# Patient Record
Sex: Female | Born: 2000 | Race: White | Hispanic: No | Marital: Single | Smoking: Never smoker
Health system: Southern US, Community
[De-identification: ages and names within clinical notes are randomized; demographics above are authoritative.]

## PROBLEM LIST (undated history)

## (undated) DIAGNOSIS — R1115 Cyclical vomiting syndrome unrelated to migraine: Secondary | ICD-10-CM

---
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
## 2019-11-03 ENCOUNTER — Emergency Department (HOSPITAL_BASED_OUTPATIENT_CLINIC_OR_DEPARTMENT_OTHER)
Admission: EM | Admit: 2019-11-03 | Discharge: 2019-11-03 | Disposition: A | Discharge status: Home / Self Care | Payer: No Typology Code available for payment source | Attending: Emergency Medicine | Admitting: Emergency Medicine

## 2019-11-03 ENCOUNTER — Other Ambulatory Visit: Payer: Self-pay

## 2019-11-03 ENCOUNTER — Emergency Department (HOSPITAL_BASED_OUTPATIENT_CLINIC_OR_DEPARTMENT_OTHER): Payer: No Typology Code available for payment source

## 2019-11-03 ENCOUNTER — Encounter (HOSPITAL_BASED_OUTPATIENT_CLINIC_OR_DEPARTMENT_OTHER): Payer: Self-pay

## 2019-11-03 DIAGNOSIS — S29002A Unspecified injury of muscle and tendon of back wall of thorax, initial encounter: Secondary | ICD-10-CM | POA: Insufficient documentation

## 2019-11-03 DIAGNOSIS — M546 Pain in thoracic spine: Secondary | ICD-10-CM

## 2019-11-03 DIAGNOSIS — Y93I9 Activity, other involving external motion: Secondary | ICD-10-CM | POA: Insufficient documentation

## 2019-11-03 DIAGNOSIS — Y92410 Unspecified street and highway as the place of occurrence of the external cause: Secondary | ICD-10-CM | POA: Insufficient documentation

## 2019-11-03 DIAGNOSIS — Y998 Other external cause status: Secondary | ICD-10-CM | POA: Diagnosis not present

## 2019-11-03 HISTORY — DX: Cyclical vomiting syndrome unrelated to migraine: R11.15

## 2019-11-03 MED ORDER — METHOCARBAMOL 500 MG PO TABS: 500.0000 mg | ORAL_TABLET | Freq: Two times a day (BID) | ORAL | 0 refills | Status: DC

## 2019-11-03 MED ORDER — IBUPROFEN 400 MG PO TABS
600.0000 mg | ORAL_TABLET | Freq: Once | ORAL | Status: AC
  Administered 2019-11-03: 20:00:00 600 mg via ORAL
  Filled 2019-11-03: qty 1
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
## 2019-11-03 NOTE — Discharge Instructions (Signed)

## 2019-11-03 NOTE — ED Notes (Signed)
ED Provider at bedside. 
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
## 2023-02-13 ENCOUNTER — Emergency Department (HOSPITAL_COMMUNITY): Payer: Self-pay

## 2023-02-13 ENCOUNTER — Other Ambulatory Visit: Payer: Self-pay

## 2023-02-13 ENCOUNTER — Emergency Department (HOSPITAL_COMMUNITY)
Admission: EM | Admit: 2023-02-13 | Discharge: 2023-02-14 | Disposition: A | Discharge status: Home / Self Care | Payer: Self-pay | Attending: Emergency Medicine | Admitting: Emergency Medicine

## 2023-02-13 ENCOUNTER — Encounter (HOSPITAL_COMMUNITY): Payer: Self-pay | Admitting: *Deleted

## 2023-02-13 DIAGNOSIS — R079 Chest pain, unspecified: Secondary | ICD-10-CM | POA: Insufficient documentation

## 2023-02-13 DIAGNOSIS — R0602 Shortness of breath: Secondary | ICD-10-CM | POA: Insufficient documentation

## 2023-02-13 DIAGNOSIS — R002 Palpitations: Secondary | ICD-10-CM | POA: Insufficient documentation

## 2023-02-13 LAB — BASIC METABOLIC PANEL
Anion gap: 9 (ref 5–15)
BUN: 9 mg/dL (ref 6–20)
CO2: 22 mmol/L (ref 22–32)
Calcium: 8.8 mg/dL — ABNORMAL LOW (ref 8.9–10.3)
Chloride: 104 mmol/L (ref 98–111)
Creatinine, Ser: 0.77 mg/dL (ref 0.44–1.00)
GFR, Estimated: >60 mL/min (ref >60)
Glucose, Bld: 117 mg/dL — ABNORMAL HIGH (ref 70–99)
Potassium: 3.6 mmol/L (ref 3.5–5.1)
Sodium: 135 mmol/L (ref 135–145)

## 2023-02-13 LAB — CBC WITH DIFFERENTIAL/PLATELET
Abs Immature Granulocytes: 0.02 10*3/uL (ref 0.00–0.07)
Basophils Absolute: 0.1 10*3/uL (ref 0.0–0.1)
Basophils Relative: 1 %
Eosinophils Absolute: 0.2 10*3/uL (ref 0.0–0.5)
Eosinophils Relative: 2 %
HCT: 39.9 % (ref 36.0–46.0)
Hemoglobin: 13.4 g/dL (ref 12.0–15.0)
Immature Granulocytes: 0 %
Lymphocytes Relative: 23 %
Lymphs Abs: 2.1 10*3/uL (ref 0.7–4.0)
MCH: 29 pg (ref 26.0–34.0)
MCHC: 33.6 g/dL (ref 30.0–36.0)
MCV: 86.4 fL (ref 80.0–100.0)
Monocytes Absolute: 0.6 10*3/uL (ref 0.1–1.0)
Monocytes Relative: 6 %
Neutro Abs: 6 10*3/uL (ref 1.7–7.7)
Neutrophils Relative %: 68 %
Platelets: 362 10*3/uL (ref 150–400)
RBC: 4.62 MIL/uL (ref 3.87–5.11)
RDW: 12.8 % (ref 11.5–15.5)
WBC: 8.9 10*3/uL (ref 4.0–10.5)
nRBC: 0 % (ref 0.0–0.2)

## 2023-02-13 LAB — I-STAT BETA HCG BLOOD, ED (MC, WL, AP ONLY): I-stat hCG, quantitative: <5 m[IU]/mL (ref <5)

## 2023-02-13 LAB — TSH: TSH: 3.898 u[IU]/mL (ref 0.350–4.500)

## 2023-02-13 LAB — TROPONIN I (HIGH SENSITIVITY): Troponin I (High Sensitivity): <2 ng/L (ref <18)

## 2023-02-13 LAB — MAGNESIUM: Magnesium: 2 mg/dL (ref 1.7–2.4)

## 2023-02-13 NOTE — ED Triage Notes (Signed)
Patient was eating dinner and noticed that her heart rate increased to around 100.  She noted increase with walking/movement to around 140  Patient is alert.  Skin warm and dry.  She is able to speak in full sentences.  Patient currently has HR of 115

## 2023-02-13 NOTE — ED Provider Notes (Signed)
[facility] Provider Note   CSN: [id number] Arrival date & time: 02/13/23  2048     History {Add pertinent medical, surgical, social history, OB history to HPI:1} Chief Complaint  Patient presents with   Chest Pain   Tachycardia    Rachael Guzman is a 22 y.o. female.  HPI   Patient without significant medical history presenting with complaints of chest pain and heart palpitations.  Patient states that this came on suddenly, she was at dinner with her boyfriend she started to feel very anxious, felt like her heart was racing, she does note that her watch said that her heart rate was in the 100s, she states that she was walking to her car from the restaurant her heart rate jumped up to 200 and she had a stabbing pain in her chest, did not radiate, states she felt as if she could not breathe, she states this lasted approximately 3 minutes and then resolved.  She states that she had another episode like this while in the car driving to the emergency room this last about 8 minutes and then resolved.  Since patient's arrival in the ED she has not had another episode,  states that this is never happened to her, she denies any pleuritic chest pain shortness of breath, she states that she has some slight chest soreness and feels fatigued but no other complaints.  no history PEs or DVTs currently not on oral birth control, no recent surgeries no long immobilizations, she denies excessive alcohol use, denies tobacco use, denies illicit drug use, she states that she mainly drinks caffeinated drinks will have her coffee once every 2 weeks had 1 today but this was early in the day.    Home Medications Prior to Admission medications   Medication Sig Start Date End Date Taking? Authorizing Provider  methocarbamol (ROBAXIN) 500 MG tablet Take 1 tablet (500 mg total) by mouth 2 (two) times daily. 11/03/19   Jacqlyn Larsen, PA-C      Allergies    Patient has no  known allergies.    Review of Systems   Review of Systems  Constitutional:  Negative for chills and fever.  Respiratory:  Positive for shortness of breath.   Cardiovascular:  Positive for chest pain and palpitations.  Gastrointestinal:  Negative for abdominal pain.  Neurological:  Negative for headaches.    Physical Exam Updated Vital Signs BP 116/76   Pulse (!) 115   Temp 97.8 F (36.6 C)   Resp 16   Ht 5' (1.524 m)   Wt 55.3 kg   SpO2 100%   BMI 23.83 kg/m  Physical Exam Vitals and nursing note reviewed.  Constitutional:      General: She is not in acute distress.    Appearance: She is not ill-appearing.  HENT:     Head: Normocephalic and atraumatic.     Nose: No congestion.  Eyes:     Conjunctiva/sclera: Conjunctivae normal.  Cardiovascular:     Rate and Rhythm: Normal rate and regular rhythm.     Pulses: Normal pulses.     Heart sounds: No murmur heard.    No friction rub. No gallop.  Pulmonary:     Effort: No respiratory distress.     Breath sounds: No wheezing, rhonchi or rales.  Musculoskeletal:     Comments: There is no unilateral leg swelling no calf tenderness no palpable cords.  Skin:    General: Skin is warm and  dry.  Neurological:     Mental Status: She is alert.  Psychiatric:        Mood and Affect: Mood normal.     ED Results / Procedures / Treatments   Labs (all labs ordered are listed, but only abnormal results are displayed) Labs Reviewed  BASIC METABOLIC PANEL - Abnormal; Notable for the following components:      Result Value   Glucose, Bld 117 (*)    Calcium 8.8 (*)    All other components within normal limits  CBC WITH DIFFERENTIAL/PLATELET  TSH  MAGNESIUM  D-DIMER, QUANTITATIVE  I-STAT BETA HCG BLOOD, ED ([facility], [facility], [facility] ONLY)  TROPONIN I (HIGH SENSITIVITY)  TROPONIN I (HIGH SENSITIVITY)    EKG None  Radiology No results found.  Procedures Procedures  {Document cardiac monitor, telemetry assessment procedure when  appropriate:1}  Medications Ordered in ED Medications - No data to display  ED Course/ Medical Decision Making/ A&P   {   Click here for ABCD2, HEART and other calculatorsREFRESH Note before signing :1}                          Medical Decision Making Amount and/or Complexity of Data Reviewed Labs: ordered. Radiology: ordered.   This patient presents to the ED for concern of her palpitations, this involves an extensive number of treatment options, and is a complaint that carries with it a high risk of complications and morbidity.  The differential diagnosis includes PE, ACS, arrhythmias, electrolyte derailment    Additional history obtained:  Additional history obtained from mother at bedside External records from outside source obtained and reviewed including orthopedic notes, ER notes   Co morbidities that complicate the patient evaluation  N/A  Social Determinants of Health:  No primary care provider    Lab Tests:  I Ordered, and personally interpreted labs.  The pertinent results include: CBC is unremarkable, BMP shows glucose of 117, for troponin is negative, magnesium 2, TSH 3.8, hCG less than 5   Imaging Studies ordered:  I ordered imaging studies including chest x-ray I independently visualized and interpreted imaging which showed *** I agree with the radiologist interpretation   Cardiac Monitoring:  The patient was maintained on a cardiac monitor.  I personally viewed and interpreted the cardiac monitored which showed an underlying rhythm of: Sinus tach without signs of ischemia   Medicines ordered and prescription drug management:  I ordered medication including N/A I have reviewed the patients home medicines and have made adjustments as needed  Critical Interventions:  ***   Reevaluation:  Presents with complaints of heart palpitations and chest pain, during my examination heart rate was mid 80s, she had benign physical exam, will add on  D-dimer for rule out of possible PE and continue monitor.    Consultations Obtained:  I requested consultation with the ***,  and discussed lab and imaging findings as well as pertinent plan - they recommend: ***    Test Considered:  ***    Rule out ****    Dispostion and problem list  After consideration of the diagnostic results and the patients response to treatment, I feel that the patent would benefit from ***.      {Document critical care time when appropriate:1} {Document review of labs and clinical decision tools ie heart score, Chads2Vasc2 etc:1}  {Document your independent review of radiology images, and any outside records:1} {Document your discussion with family members, caretakers, and with consultants:1} {Document  social determinants of health affecting pt's care:1} {Document your decision making why or why not admission, treatments were needed:1} Final Clinical Impression(s) / ED Diagnoses Final diagnoses:  None    Rx / DC Orders ED Discharge Orders     None

## 2023-02-13 NOTE — ED Provider Triage Note (Signed)
Emergency Medicine Provider Triage Evaluation Note  Rachael Guzman , a 22 y.o. female  was evaluated in triage.  Pt complains of tachycardia, chest pain and lightheadedness.  She reports she was eating dinner a few hours ago and all of a sudden felt her heart rate spike to around 100 as shown by her Apple Watch.  As she relaxed it started to come down but then when she was walking to the car it spiked nearly 140 and she started to have severe chest pain.  She sat in the car and got better however then she had an episode of spiking near 200 and she felt as though she was going to pass out.  This is never happened before.  No history of thyroid disorder, caffeine use, tobacco use, OCPs, DVT/PE.  Review of Systems  Positive:  Negative:   Physical Exam  BP 116/76   Pulse (!) 115   Temp 97.8 F (36.6 C)   Resp 16   Ht 5' (1.524 m)   Wt 55.3 kg   SpO2 100%   BMI 23.83 kg/m  Gen:   Awake, no distress   Resp:  Normal effort  MSK:   Moves extremities without difficulty  Other:  Regular rhythm but tachycardic.  Medical Decision Making  Medically screening exam initiated at 9:13 PM.  Appropriate orders placed.  Rachael Guzman was informed that the remainder of the evaluation will be completed by another provider, this initial triage assessment does not replace that evaluation, and the importance of remaining in the ED until their evaluation is complete.     Rhae Hammock, [place] 02/13/23 2115

## 2023-02-14 LAB — D-DIMER, QUANTITATIVE: D-Dimer, Quant: <0.27 ug/mL-FEU (ref 0.00–0.50)

## 2023-02-14 LAB — TROPONIN I (HIGH SENSITIVITY): Troponin I (High Sensitivity): <2 ng/L (ref <18)

## 2023-02-14 NOTE — Discharge Instructions (Signed)
Lab work imaging were reassuring, possible that your heart went into a fast rhythm called SVT or possible PVCs, these are typically both benign and resolve on their own.  Some potential triggers include stress, dehydration, lack of sleep, anxiety, caffeinated drinks tried to limit these as much as possible to prevent future attacks.  I recommend that you follow-up with cardiology or your PCP.  Come back to the emergency department if you develop chest pain, shortness of breath, severe abdominal pain, uncontrolled nausea, vomiting, diarrhea.

## 2023-03-10 ENCOUNTER — Ambulatory Visit: Payer: 59 | Attending: Internal Medicine | Admitting: Cardiovascular Disease

## 2023-03-10 ENCOUNTER — Ambulatory Visit: Payer: No Typology Code available for payment source | Admitting: Internal Medicine

## 2023-03-10 ENCOUNTER — Encounter: Payer: Self-pay | Admitting: Cardiovascular Disease

## 2023-03-10 VITALS — BP 96/58 | HR 76 | Ht 60.0 in | Wt 119.2 lb

## 2023-03-10 DIAGNOSIS — R002 Palpitations: Secondary | ICD-10-CM | POA: Diagnosis not present
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
## 2023-03-10 NOTE — Progress Notes (Deleted)
Cardiology Office Note:    Date:  03/10/2023   ID:  Rachael Guzman, DOB August 07, 2001, MRN [id number]  PCP:  Patient, No Pcp Per   [facility] Providers Cardiologist:  None { Click to update primary MD,subspecialty MD or APP then REFRESH:1}    Referring MD: No ref. provider found   No chief complaint on file. ***  History of Present Illness:    Rachael Guzman is a 22 y.o. female referral from the ED for palpitations. She noted an episode of anxiousness and rapid heart racing at dinner. She noted HR went to 200 bpm. EKG was normal in the ED. TSH is nl.  Past Medical History:  Diagnosis Date   Cyclic vomiting syndrome     Current Medications: No outpatient medications have been marked as taking for the 03/10/23 encounter (Appointment) with Janina Mayo, MD.     Allergies:   Patient has no known allergies.   Social History   Socioeconomic History   Marital status: Single    Spouse name: Not on file   Number of children: Not on file   Years of education: Not on file   Highest education level: Not on file  Occupational History   Not on file  Tobacco Use   Smoking status: Never   Smokeless tobacco: Never  Vaping Use   Vaping Use: Never used  Substance and Sexual Activity   Alcohol use: Never   Drug use: Never   Sexual activity: Not on file  Other Topics Concern   Not on file  Social History Narrative   Not on file   Social Determinants of Health   Financial Resource Strain: Not on file  Food Insecurity: Not on file  Transportation Needs: Not on file  Physical Activity: Not on file  Stress: Not on file  Social Connections: Not on file     Family History: The patient's ***family history is not on file.  ROS:   Please see the history of present illness.    *** All other systems reviewed and are negative.  EKGs/Labs/Other Studies Reviewed:    The following studies were reviewed today: ***  EKG:  EKG is *** ordered today.  The ekg ordered today  demonstrates ***  Recent Labs: 02/13/2023: BUN 9; Creatinine, Ser 0.77; Hemoglobin 13.4; Magnesium 2.0; Platelets 362; Potassium 3.6; Sodium 135; TSH 3.898  Recent Lipid Panel No results found for: "CHOL", "TRIG", "HDL", "CHOLHDL", "VLDL", "LDLCALC", "LDLDIRECT"   Risk Assessment/Calculations:   {Does this patient have ATRIAL FIBRILLATION?[phone]}  No BP recorded.  {Refresh Note OR Click here to enter BP  :1}***         Physical Exam:    VS:  There were no vitals taken for this visit.    Wt Readings from Last 3 Encounters:  02/13/23 122 lb (55.3 kg)  11/03/19 (P) 108 lb (49 kg)     GEN: *** Well nourished, well developed in no acute distress HEENT: Normal NECK: No JVD; No carotid bruits LYMPHATICS: No lymphadenopathy CARDIAC: ***RRR, no murmurs, rubs, gallops RESPIRATORY:  Clear to auscultation without rales, wheezing or rhonchi  ABDOMEN: Soft, non-tender, non-distended MUSCULOSKELETAL:  No edema; No deformity  SKIN: Warm and dry NEUROLOGIC:  Alert and oriented x 3 PSYCHIATRIC:  Normal affect   ASSESSMENT:    No diagnosis found. PLAN:    In order of problems listed above:  ***      {Are you ordering a CV Procedure (e.g. stress test, cath, DCCV, TEE,  etc)?   Press F2        :[id number]    Medication Adjustments/Labs and Tests Ordered: Current medicines are reviewed at length with the patient today.  Concerns regarding medicines are outlined above.  No orders of the defined types were placed in this encounter.  No orders of the defined types were placed in this encounter.   There are no Patient Instructions on file for this visit.   Signed, Janina Mayo, MD  03/10/2023 7:56 AM    [place]

## 2023-03-10 NOTE — Patient Instructions (Signed)
Medication Instructions:  Your physician recommends that you continue on your current medications as directed. Please refer to the Current Medication list given to you today.  *If you need a refill on your cardiac medications before your next appointment, please call your pharmacy*  Follow-Up: At [facility], you and your health needs are our priority.  As part of our continuing mission to provide you with exceptional heart care, we have created designated Provider Care Teams.  These Care Teams include your primary Cardiologist (physician) and Advanced Practice Providers (APPs -  Physician Assistants and Nurse Practitioners) who all work together to provide you with the care you need, when you need it.  Your next appointment:   6-8 week(s)  Provider:   Lars Mage, MD   Other Instructions Kardia for monitoring of EKGs at home: You can look into the [facility] device by [employer].This device is purchased by you and it connects to an application you download to your smart phone.  It can detect abnormal heart rhythms and alert you to contact your doctor for further evaluation. The device is approximately $90 and the phone application is free.  The web site is:  https://www.[employer]ivecor.com

## 2023-04-10 ENCOUNTER — Ambulatory Visit (HOSPITAL_COMMUNITY): Payer: 59 | Attending: Cardiovascular Disease

## 2023-04-10 DIAGNOSIS — R002 Palpitations: Secondary | ICD-10-CM

## 2023-04-10 LAB — ECHOCARDIOGRAM COMPLETE
Area-P 1/2: 3.99 cm2
S' Lateral: 2.7 cm

## 2023-04-24 ENCOUNTER — Ambulatory Visit: Payer: 59 | Attending: Cardiovascular Disease | Admitting: Cardiovascular Disease

## 2023-04-24 VITALS — BP 112/68 | HR 75 | Ht 61.0 in | Wt 115.0 lb

## 2023-04-24 DIAGNOSIS — R002 Palpitations: Secondary | ICD-10-CM

## 2023-04-24 NOTE — Patient Instructions (Signed)
Medication Instructions:  Your physician recommends that you continue on your current medications as directed. Please refer to the Current Medication list given to you today. *If you need a refill on your cardiac medications before your next appointment, please call your pharmacy*   Follow-Up: At [facility], you and your health needs are our priority.  As part of our continuing mission to provide you with exceptional heart care, we have created designated Provider Care Teams.  These Care Teams include your primary Cardiologist (physician) and Advanced Practice Providers (APPs -  Physician Assistants and Nurse Practitioners) who all work together to provide you with the care you need, when you need it.  We recommend signing up for the patient portal called "MyChart".  Sign up information is provided on this After Visit Summary.  MyChart is used to connect with patients for Virtual Visits (Telemedicine).  Patients are able to view lab/test results, encounter notes, upcoming appointments, etc.  Non-urgent messages can be sent to your provider as well.   To learn more about what you can do with MyChart, go to [email or web address].    Your next appointment:   1 year(s)  Provider:   Augustus Mealor, MD  

## 2023-04-24 NOTE — Progress Notes (Signed)
Electrophysiology Office Note:    Date:  04/24/2023   ID:  Rachael Guzman, DOB 2000-12-18, MRN [id number]  PCP:  Patient, No Pcp Per   [facility] Providers Cardiologist:  None     Referring MD: No ref. provider found   History of Present Illness:    Rachael Guzman is a 22 y.o. female with a hx listed below, significant for cyclic vomiting syndrome, referred for arrhythmia management.  She presented to the ER on February 13, 2023 with complaints of chest pain and palpitations.  She was having dinner with her boyfriend, started to feel anxious, noted palpitations.  Her watch indicated her heart rate was 100 bpm, but as she got up to walk to her car the rate increased to 200.  This lasted approximately 3 minutes then resolved.  She had another episode as she was driving.  She has a history of rapid heart rates that occur with exertion. 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 She has an apple watch that will register rates sometimes over 200 bpm -- these rates always occur with exertion and resolve to normal with rest.  The episode the precipitated her ER visit was not associated with exertional, but she was experiencing some emotional stress. She notes that she "just wasn't in a good situation" at the time.   Since her last clinic visit, she has been doing very well.  She has not had any more severe episodes of heart racing.  She did get a Kardia mobile and has obtained a few strips which I reviewed today.  These all appeared to show sinus rhythm the many of the strips are unintelligible due to artifact.  She has not been able to use her device at work and will need a doctor's note.  Past Medical History:  Diagnosis Date   Cyclic vomiting syndrome     No past surgical history on file.  Current Medications: No outpatient medications have been marked as taking for the 04/24/23 encounter (Office Visit) with Babatunde Seago, Roberts Gaudy, MD.     Allergies:    Patient has no known allergies.   Social and Family History: Reviewed in Epic  ROS:   Please see the history of present illness.    All other systems reviewed and are negative.  EKGs/Labs/Other Studies Reviewed Today:    Echocardiogram:  TTE 04/10/2023 Normal structure and function, ejection fraction 55 to 60%.   Monitors:   Stress testing:    Advanced imaging:    EKG:  Last EKG results: today - sinus rhythm with short PR   Recent Labs: 02/13/2023: BUN 9; Creatinine, Ser 0.77; Hemoglobin 13.4; Magnesium 2.0; Platelets 362; Potassium 3.6; Sodium 135; TSH 3.898     Physical Exam:    VS:  BP 112/68   Pulse 75   Ht 5\' 1"  (1.549 m)   Wt 115 lb (52.2 kg)   SpO2 98%   BMI 21.73 kg/m     Wt Readings from Last 3 Encounters:  04/24/23 115 lb (52.2 kg)  03/10/23 119 lb 3.2 oz (54.1 kg)  02/13/23 122 lb (55.3 kg)     GEN: Well nourished, well developed in no acute distress CARDIAC: RRR, no murmurs, rubs, gallops RESPIRATORY:  Normal work of breathing MUSCULOSKELETAL: no edema    ASSESSMENT & PLAN:    Tachycardia Suspect sinus tachycardia as episodes correlate with exertion or emotional stress I reviewed strips from her cardia mobile today that show normal rhythm  She will continue to obtain strips when she has rapid palpitations We we will provide her with a note so that she can use her monitor while at work. The echocardiogram today with the patient showed normal structure and function   We again had a long discussion about the heart's response to stress and that rapid heart rates are normal in stressful situations. I offered reassurance.    37 minutes spent on this visit today.    Medication Adjustments/Labs and Tests Ordered: Current medicines are reviewed at length with the patient today.  Concerns regarding medicines are outlined above.  No orders of the defined types were placed in this encounter.  No orders of the defined types were placed in this  encounter.    Signed, Maurice Small, MD  04/24/2023 3:00 PM    [facility]

## 2024-10-19 ENCOUNTER — Encounter: Payer: Self-pay | Admitting: Emergency Medicine
